# Patient Record
Sex: Female | Born: 1995 | Race: Black or African American | Hispanic: No | Marital: Single | Smoking: Never smoker
Health system: Southern US, Community
[De-identification: ages and names within clinical notes are randomized; demographics above are authoritative.]

## PROBLEM LIST (undated history)

## (undated) ENCOUNTER — Emergency Department (HOSPITAL_COMMUNITY): Discharge status: Absent without leave | Payer: Self-pay

---

## 2019-05-27 ENCOUNTER — Encounter (HOSPITAL_COMMUNITY): Payer: Self-pay

## 2019-05-27 ENCOUNTER — Other Ambulatory Visit: Payer: Self-pay

## 2019-05-27 ENCOUNTER — Emergency Department (HOSPITAL_COMMUNITY)
Admission: EM | Admit: 2019-05-27 | Discharge: 2019-05-27 | Disposition: A | Discharge status: Home / Self Care | Payer: Federal, State, Local not specified - PPO | Attending: Emergency Medicine | Admitting: Emergency Medicine

## 2019-05-27 ENCOUNTER — Emergency Department (HOSPITAL_COMMUNITY): Payer: Federal, State, Local not specified - PPO

## 2019-05-27 DIAGNOSIS — R1031 Right lower quadrant pain: Secondary | ICD-10-CM

## 2019-05-27 LAB — I-STAT BETA HCG BLOOD, ED (MC, WL, AP ONLY): I-stat hCG, quantitative: <5 m[IU]/mL

## 2019-05-27 LAB — COMPREHENSIVE METABOLIC PANEL WITH GFR
ALT: 11 U/L (ref 0–44)
AST: 14 U/L — ABNORMAL LOW (ref 15–41)
Albumin: 4.1 g/dL (ref 3.5–5.0)
Alkaline Phosphatase: 59 U/L (ref 38–126)
Anion gap: 9 (ref 5–15)
BUN: 9 mg/dL (ref 6–20)
CO2: 25 mmol/L (ref 22–32)
Calcium: 9.3 mg/dL (ref 8.9–10.3)
Chloride: 105 mmol/L (ref 98–111)
Creatinine, Ser: 0.49 mg/dL (ref 0.44–1.00)
GFR calc Af Amer: >60 mL/min
GFR calc non Af Amer: >60 mL/min
Glucose, Bld: 90 mg/dL (ref 70–99)
Potassium: 3.9 mmol/L (ref 3.5–5.1)
Sodium: 139 mmol/L (ref 135–145)
Total Bilirubin: 0.8 mg/dL (ref 0.3–1.2)
Total Protein: 7.5 g/dL (ref 6.5–8.1)

## 2019-05-27 LAB — URINALYSIS, ROUTINE W REFLEX MICROSCOPIC
Bilirubin Urine: NEGATIVE
Glucose, UA: NEGATIVE mg/dL
Hgb urine dipstick: NEGATIVE
Ketones, ur: NEGATIVE mg/dL
Leukocytes,Ua: NEGATIVE
Nitrite: NEGATIVE
Protein, ur: NEGATIVE mg/dL
Specific Gravity, Urine: >1.046 — ABNORMAL HIGH (ref 1.005–1.030)
pH: 5 (ref 5.0–8.0)

## 2019-05-27 LAB — WET PREP, GENITAL
Sperm: NONE SEEN
Trich, Wet Prep: NONE SEEN
Yeast Wet Prep HPF POC: NONE SEEN

## 2019-05-27 LAB — CBC WITH DIFFERENTIAL/PLATELET
Abs Immature Granulocytes: 0.02 10*3/uL (ref 0.00–0.07)
Basophils Absolute: 0 10*3/uL (ref 0.0–0.1)
Basophils Relative: 1 %
Eosinophils Absolute: 0.1 10*3/uL (ref 0.0–0.5)
Eosinophils Relative: 2 %
HCT: 36.6 % (ref 36.0–46.0)
Hemoglobin: 12.1 g/dL (ref 12.0–15.0)
Immature Granulocytes: 0 %
Lymphocytes Relative: 25 %
Lymphs Abs: 1.6 10*3/uL (ref 0.7–4.0)
MCH: 27 pg (ref 26.0–34.0)
MCHC: 33.1 g/dL (ref 30.0–36.0)
MCV: 81.7 fL (ref 80.0–100.0)
Monocytes Absolute: 0.5 10*3/uL (ref 0.1–1.0)
Monocytes Relative: 8 %
Neutro Abs: 4 10*3/uL (ref 1.7–7.7)
Neutrophils Relative %: 64 %
Platelets: 233 10*3/uL (ref 150–400)
RBC: 4.48 MIL/uL (ref 3.87–5.11)
RDW: 15.3 % (ref 11.5–15.5)
WBC: 6.2 10*3/uL (ref 4.0–10.5)
nRBC: 0 % (ref 0.0–0.2)

## 2019-05-27 LAB — LIPASE, BLOOD: Lipase: 25 U/L (ref 11–51)

## 2019-05-27 MED ORDER — IOHEXOL 300 MG/ML  SOLN
100.0000 mL | Freq: Once | INTRAMUSCULAR | Status: AC | PRN
  Administered 2019-05-27: 100 mL via INTRAVENOUS

## 2019-05-27 MED ORDER — MORPHINE SULFATE (PF) 4 MG/ML IV SOLN
4.0000 mg | Freq: Once | INTRAVENOUS | Status: AC
  Administered 2019-05-27: 4 mg via INTRAVENOUS
  Filled 2019-05-27: qty 1

## 2019-05-27 MED ORDER — SODIUM CHLORIDE (PF) 0.9 % IJ SOLN
INTRAMUSCULAR | Status: AC
  Filled 2019-05-27: qty 50

## 2019-05-27 MED ORDER — ONDANSETRON HCL 4 MG/2ML IJ SOLN
4.0000 mg | Freq: Once | INTRAMUSCULAR | Status: AC
  Administered 2019-05-27: 4 mg via INTRAVENOUS
  Filled 2019-05-27: qty 2

## 2019-05-27 MED ORDER — SODIUM CHLORIDE 0.9 % IV BOLUS
1000.0000 mL | Freq: Once | INTRAVENOUS | Status: AC
  Administered 2019-05-27: 1000 mL via INTRAVENOUS

## 2019-05-27 MED ORDER — SODIUM CHLORIDE 0.9% FLUSH: 3.0000 mL | Freq: Once | INTRAVENOUS | Status: DC

## 2019-05-27 NOTE — ED Notes (Signed)
Pt. Unable to urinate at this time. Will collect urine when pt. Voids. Nurse aware.  

## 2019-05-27 NOTE — ED Triage Notes (Signed)
Pt presents with c/o RLQ abdominal pain that started several hours ago. Pt denies any N/V/D.

## 2019-05-27 NOTE — Discharge Instructions (Addendum)
Your evaluation today is reassuring, your labs and imaging look good I do not see any evidence of appendicitis or an ovarian cyst.  Use ibuprofen and/or Tylenol to treat your pain, if you develop new or worsening abdominal pain, vomiting, fevers or any other new or concerning symptoms please return to the ED.

## 2019-05-27 NOTE — ED Provider Notes (Signed)
[facility]-EMERGENCY DEPT Provider Note   CSN: [id number] Arrival date & time: 05/27/19  9470     History Chief Complaint  Patient presents with  . Abdominal Pain    Sandra Silva is a 24 y.o. female.  Sandra Silva is a 24 y.o. female who is otherwise healthy, who presents for evaluation of right lower quadrant abdominal pain that started suddenly about 2 hours prior to arrival and woke her from sleep.  She reports the pain is a constant ache that intermittently become sharper.  Nothing else makes the pain better or worse, she is not tried any medications prior to arrival.  She denies any associated nausea or vomiting, no diarrhea or constipation, no dysuria, urinary frequency or hematuria.  Patient denies any similar history of pain, no history of ovarian cyst or torsion, no history of kidney stones.  She has never had any abdominal surgeries.  Denies any vaginal bleeding or discharge, just had STD testing completed with her OB/GYN a week ago and has not had any new sexual partner since then.  No other aggravating or alleviating factors.        History reviewed. No pertinent past medical history.  There are no problems to display for this patient.   History reviewed. No pertinent surgical history.   OB History   No obstetric history on file.     No family history on file.  Social History   Tobacco Use  . Smoking status: Never Smoker  . Smokeless tobacco: Never Used  Substance Use Topics  . Alcohol use: Never  . Drug use: Never    Home Medications Prior to Admission medications   Medication Sig Start Date End Date Taking? Authorizing Provider  ibuprofen (ADVIL) 200 MG tablet Take 400 mg by mouth every 6 (six) hours as needed for mild pain.   Yes [provider]  valACYclovir (VALTREX) 500 MG tablet Take 500 mg by mouth 2 (two) times daily as needed (outbreak).  12/29/18  Yes [provider]    Allergies    Patient has no known  allergies.  Review of Systems   Review of Systems  Constitutional: Negative for chills and fever.  HENT: Negative.   Respiratory: Negative for cough and shortness of breath.   Cardiovascular: Negative for chest pain.  Gastrointestinal: Positive for abdominal pain. Negative for constipation, diarrhea, nausea and vomiting.  Genitourinary: Negative for dysuria, frequency, vaginal bleeding and vaginal discharge.  Musculoskeletal: Negative for arthralgias and myalgias.  Skin: Negative for color change and rash.    Physical Exam Updated Vital Signs BP 113/81   Pulse (!) 55   Temp 97.9 F (36.6 C) (Oral)   Resp 14   LMP 05/11/2018   SpO2 98%   Physical Exam Vitals and nursing note reviewed. Exam conducted with a chaperone present.  Constitutional:      General: She is not in acute distress.    Appearance: She is well-developed. She is not diaphoretic.  HENT:     Head: Normocephalic and atraumatic.  Eyes:     General:        Right eye: No discharge.        Left eye: No discharge.     Pupils: Pupils are equal, round, and reactive to light.  Cardiovascular:     Rate and Rhythm: Normal rate and regular rhythm.     Heart sounds: Normal heart sounds.  Pulmonary:     Effort: Pulmonary effort is normal. No respiratory distress.  Breath sounds: Normal breath sounds. No wheezing or rales.  Abdominal:     General: Bowel sounds are normal. There is no distension.     Palpations: Abdomen is soft. There is no mass.     Tenderness: There is abdominal tenderness in the right lower quadrant. There is no guarding.     Comments: Abdomen is soft, nondistended, bowel sounds present throughout, there is tenderness in the right lower quadrant without guarding or peritoneal signs.  No CVA tenderness bilaterally.  Genitourinary:    Comments: Chaperone present during pelvic exam. No external genital lesions noted. Speculum exam reveals no discharge or bleeding, no cervical lesions. On bimanual  exam there is no cervical motion tenderness, no palpable adnexal masses mild tenderness on the right Musculoskeletal:        General: No deformity.     Cervical back: Neck supple.  Skin:    General: Skin is warm and dry.     Capillary Refill: Capillary refill takes less than 2 seconds.  Neurological:     Mental Status: She is alert.     Coordination: Coordination normal.     Comments: Speech is clear, able to follow commands Moves extremities without ataxia, coordination intact  Psychiatric:        Mood and Affect: Mood normal.        Behavior: Behavior normal.     ED Results / Procedures / Treatments   Labs (all labs ordered are listed, but only abnormal results are displayed) Labs Reviewed  WET PREP, GENITAL - Abnormal; Notable for the following components:      Result Value   Clue Cells Wet Prep HPF POC PRESENT (*)    WBC, Wet Prep HPF POC RARE (*)    All other components within normal limits  COMPREHENSIVE METABOLIC PANEL - Abnormal; Notable for the following components:   AST 14 (*)    All other components within normal limits  URINALYSIS, ROUTINE W REFLEX MICROSCOPIC - Abnormal; Notable for the following components:   APPearance CLEAR (*)    Specific Gravity, Urine >1.046 (*)    All other components within normal limits  LIPASE, BLOOD  CBC WITH DIFFERENTIAL/PLATELET  I-STAT BETA HCG BLOOD, ED ([facility], [facility], [facility] ONLY)    EKG None  Radiology CT ABDOMEN PELVIS W CONTRAST  Result Date: 05/27/2019 CLINICAL DATA:  Right lower quadrant pain beginning this morning. Suspect appendicitis. EXAM: CT ABDOMEN AND PELVIS WITH CONTRAST TECHNIQUE: Multidetector CT imaging of the abdomen and pelvis was performed using the standard protocol following bolus administration of intravenous contrast. CONTRAST:  OMNIPAQUE IOHEXOL 300 MG/ML  SOLN COMPARISON:  None. FINDINGS: Lower chest: No acute findings in the lung bases. Minimal linear scarring/atelectasis over the right middle lobe.  Hepatobiliary: Liver, gallbladder and biliary tree are normal. Pancreas: Normal. Spleen: Normal. Adrenals/Urinary Tract: Adrenal glands are normal. Kidneys are normal in size without hydronephrosis or nephrolithiasis. Ureters and bladder are normal. Stomach/Bowel: Stomach is normal. Small bowel is unremarkable. The appendix is normal. Air and stool present throughout the colon which is otherwise normal. Vascular/Lymphatic: Abdominal aorta is normal in caliber. No evidence of adenopathy. Reproductive: Uterus and ovaries are unremarkable. Other: No significant free fluid or focal inflammatory change. Musculoskeletal: Unremarkable. IMPRESSION: No acute findings in the abdomen/pelvis.  Normal appendix. Electronically Signed   By: Elberta Fortis M.D.   On: 05/27/2019 11:56    Procedures Procedures (including critical care time)  Medications Ordered in ED Medications  sodium chloride flush (NS) 0.9 % injection  3 mL (has no administration in time range)  sodium chloride (PF) 0.9 % injection (has no administration in time range)  sodium chloride 0.9 % bolus 1,000 mL (0 mLs Intravenous Stopped 05/27/19 1350)  ondansetron (ZOFRAN) injection 4 mg (4 mg Intravenous Given 05/27/19 1049)  morphine 4 MG/ML injection 4 mg (4 mg Intravenous Given 05/27/19 1050)  iohexol (OMNIPAQUE) 300 MG/ML solution 100 mL (100 mLs Intravenous Contrast Given 05/27/19 1135)    ED Course  I have reviewed the triage vital signs and the nursing notes.  Pertinent labs & imaging results that were available during my care of the patient were reviewed by me and considered in my medical decision making (see chart for details).    MDM Rules/Calculators/A&P                      Patient presents to the ED with complaints of abdominal pain. Patient nontoxic appearing, in no apparent distress, vitals WNL . On exam patient tender to palpation in the right lower quadrant without guarding or peritoneal signs, no peritoneal signs.  On physical  exam patient has no cervical motion tenderness to suggest PID, no significant discharge and she recently had negative STD testing last week.  She does have some mild adnexal tenderness but reports pain is worse with palpation on the abdomen than during pelvic exam.  Will evaluate with labs and CT abdomen pelvis. Analgesics, anti-emetics, and fluids administered.   ER work-up reviewed:  CBC: No leukocytosis, normal hemoglobin CMP: No electrolyte derangements, normal renal and liver function Lipase: Normal UA: No signs of urinary tract infection Preg test: Negative Imaging: CT abdomen pelvis shows normal appendix, there is also no evidence of adnexal mass or ovarian cyst, making an ovarian torsion extremely unlikely.  No other acute abnormalities noted on CT  On repeat abdominal exam patient remains without peritoneal signs, doubt cholecystitis, pancreatitis, diverticulitis, appendicitis, bowel obstruction/perforation,  PID, or ectopic pregnancy. Patient tolerating PO in the emergency department. Will discharge home with supportive measures. I discussed results, treatment plan, need for PCP follow-up, and return precautions with the patient. Provided opportunity for questions, patient confirmed understanding and is in agreement with plan.    Final Clinical Impression(s) / ED Diagnoses Final diagnoses:  Right lower quadrant abdominal pain    Rx / DC Orders ED Discharge Orders    None       Jacqlyn Larsen, [place] 05/27/19 1738    Daleen Bo, MD 05/28/19 [phone]

## 2020-08-22 IMAGING — CT CT ABD-PELV W/ CM
2 of 3 series · 13 of 36 positions shown, 19 images · IV contrast (omnipaque)
Comparison: None.

CLINICAL DATA: Right lower quadrant pain beginning this morning.
Suspect appendicitis.

EXAM:
CT ABDOMEN AND PELVIS WITH CONTRAST
TECHNIQUE: Multidetector CT imaging of the abdomen and pelvis was performed
using the standard protocol following bolus administration of
intravenous contrast.
CONTRAST:  100mL OMNIPAQUE IOHEXOL 300 MG/ML  SOLN

[Series 2: axial st · axial · 0.75mm/px · z∈[-491,-61]mm · 12 of 98 slices shown, 17 images]
[im 6/98  soft-tissue]
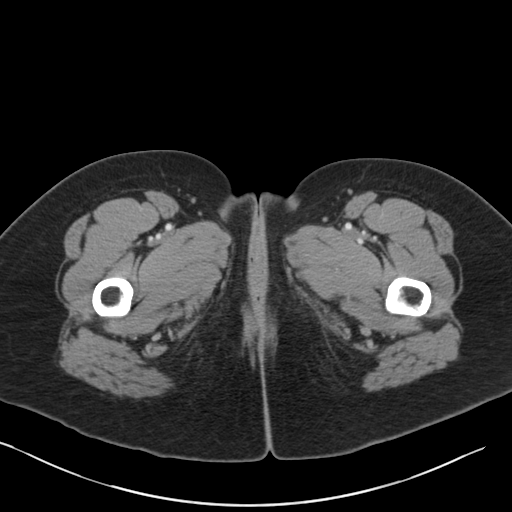
[im 6/98  bone]
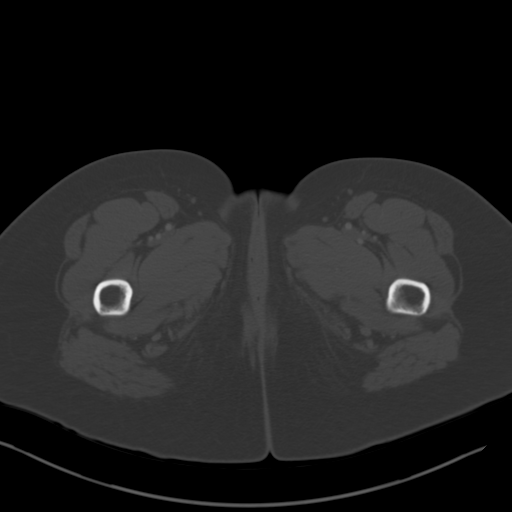
[im 18/98  soft-tissue]
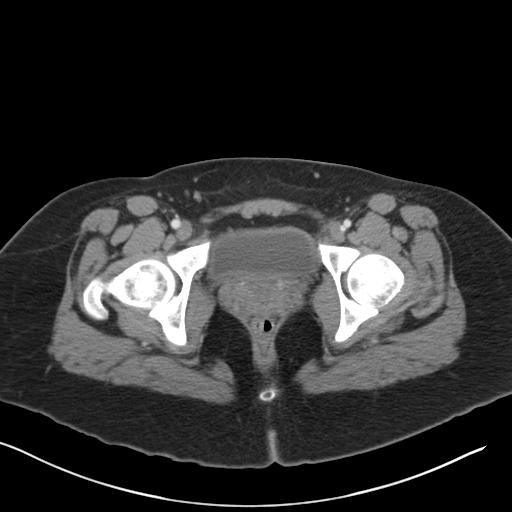
[im 23/98  soft-tissue]
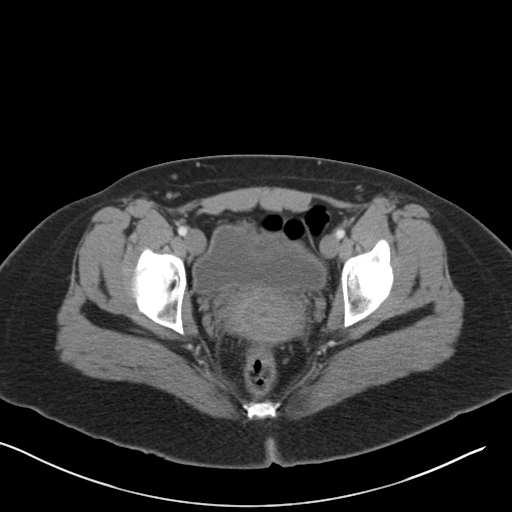
[im 35/98  soft-tissue]
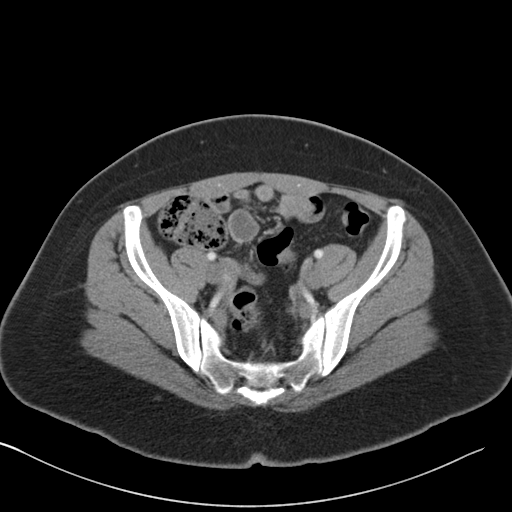
[im 40/98  soft-tissue]
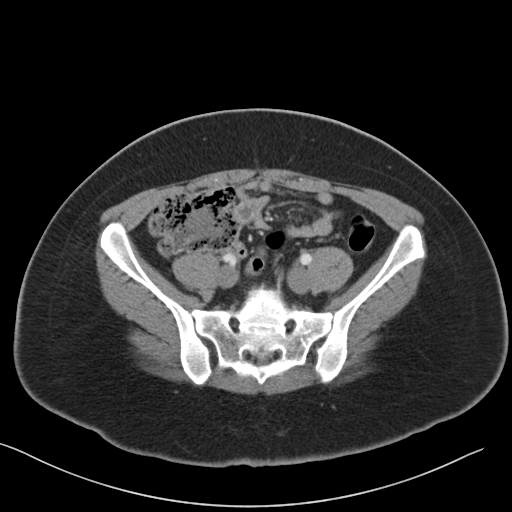
[im 52/98  soft-tissue]
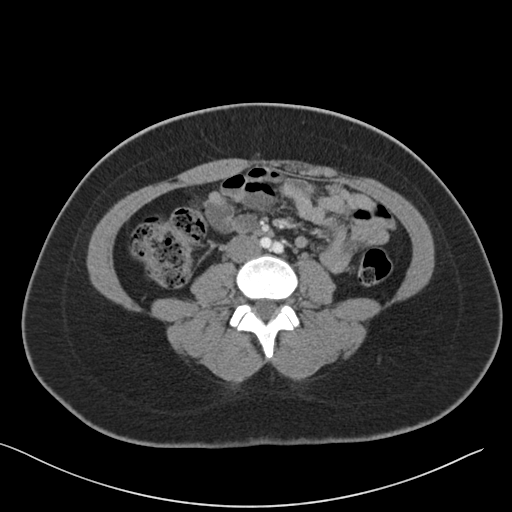
[im 58/98  soft-tissue]
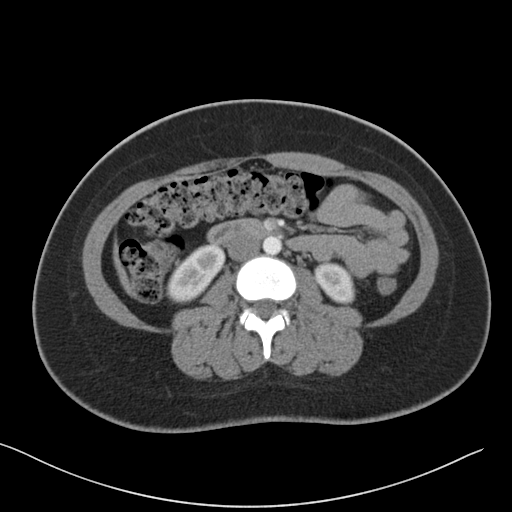
[im 63/98  soft-tissue]
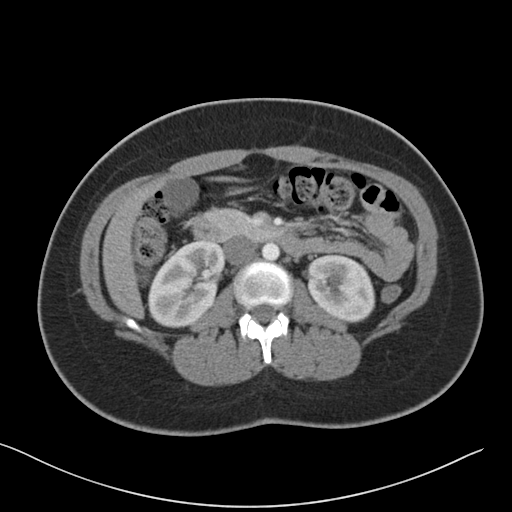
[im 75/98  soft-tissue]
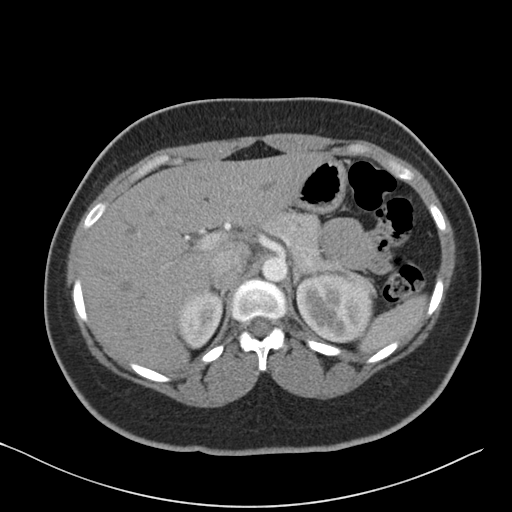
[im 75/98  lung]
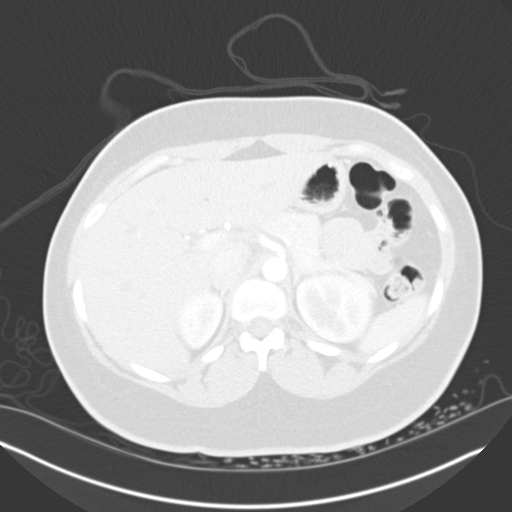
[im 75/98  bone]
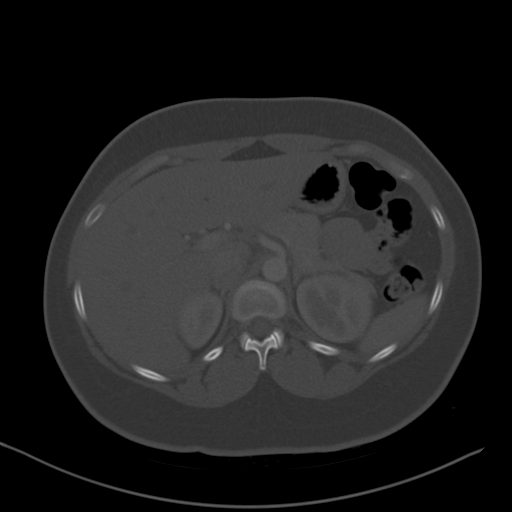
[im 80/98  soft-tissue]
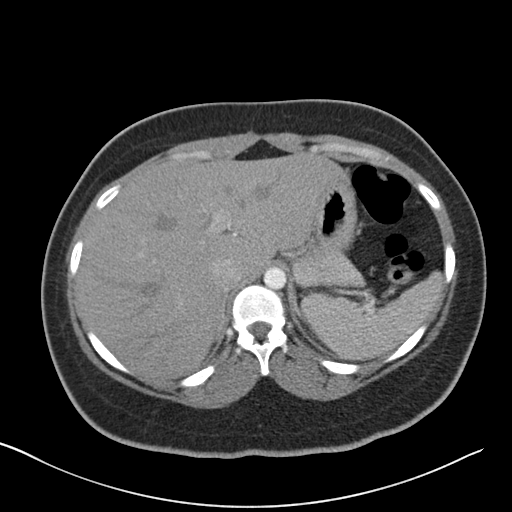
[im 80/98  lung]
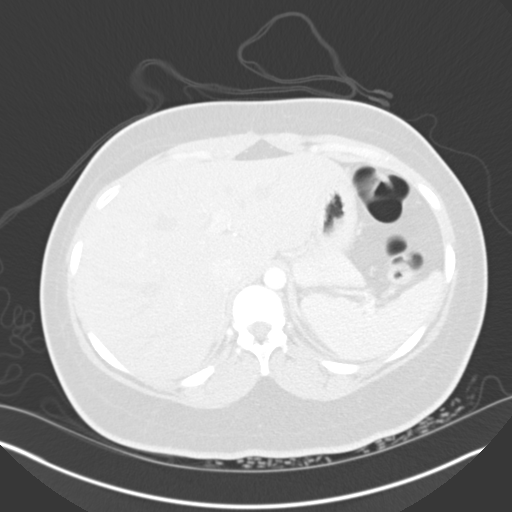
[im 86/98  lung]
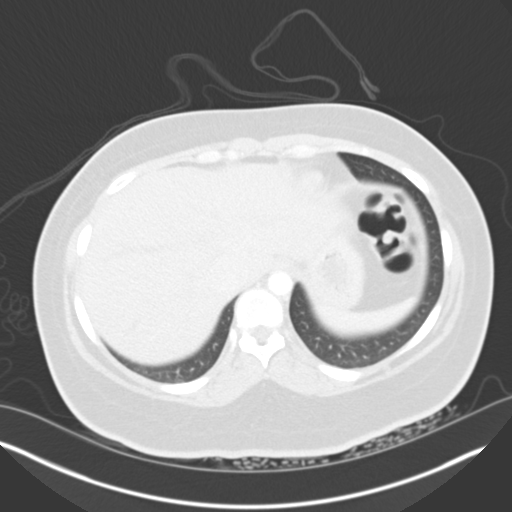
[im 92/98  soft-tissue]
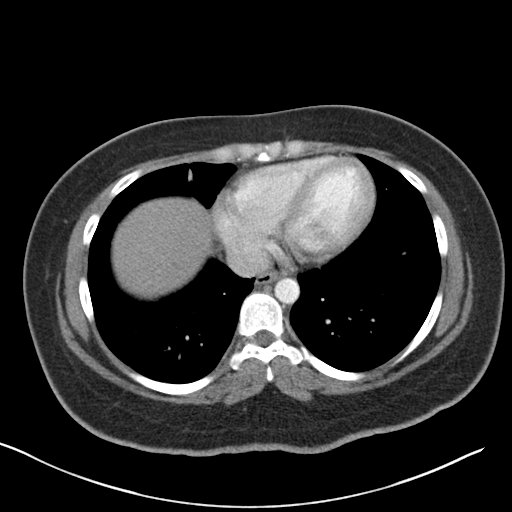
[im 92/98  lung]
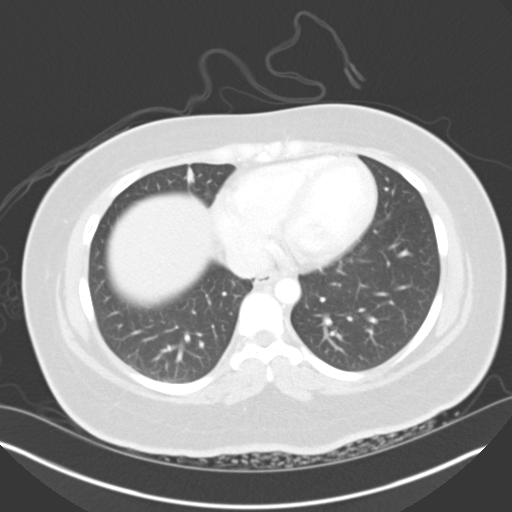

[Series 5: sagittal st · sagittal · 0.77mm/px · 1 of 172 slices shown, 2 images]
[im 58/172  soft-tissue]
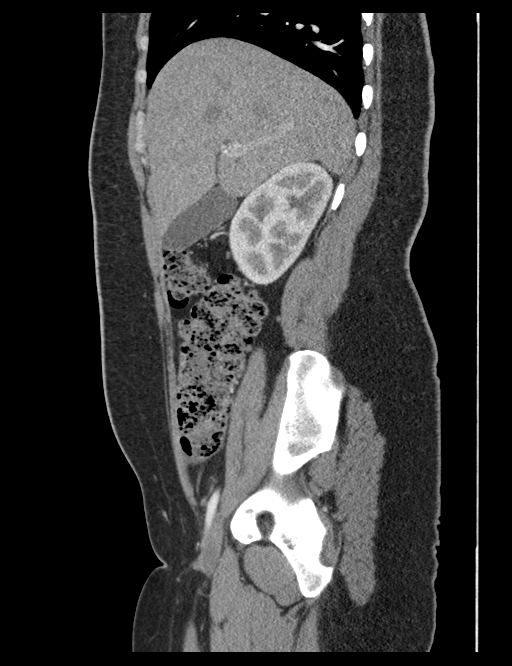
[im 58/172  bone]
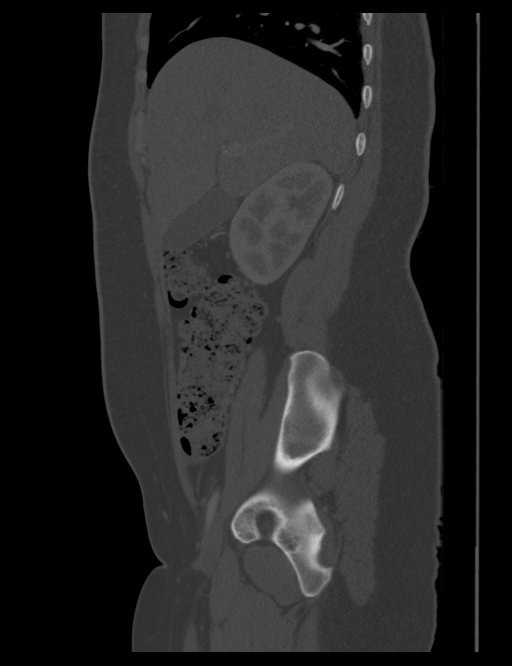

[13 of 36 positions shown; findings below may reference images not displayed]

FINDINGS: Lower chest: No acute findings in the lung bases. Minimal linear
scarring/atelectasis over the right middle lobe.

Hepatobiliary: Liver, gallbladder and biliary tree are normal.

Pancreas: Normal.

Spleen: Normal.

Adrenals/Urinary Tract: Adrenal glands are normal. Kidneys are
normal in size without hydronephrosis or nephrolithiasis. Ureters
and bladder are normal.

Stomach/Bowel: Stomach is normal. Small bowel is unremarkable. The
appendix is normal. Air and stool present throughout the colon which
is otherwise normal.

Vascular/Lymphatic: Abdominal aorta is normal in caliber. No
evidence of adenopathy.

Reproductive: Uterus and ovaries are unremarkable.

Other: No significant free fluid or focal inflammatory change.

Musculoskeletal: Unremarkable.
IMPRESSION: No acute findings in the abdomen/pelvis.  Normal appendix.

## 2020-11-24 ENCOUNTER — Other Ambulatory Visit: Payer: Self-pay

## 2020-11-24 ENCOUNTER — Ambulatory Visit (HOSPITAL_COMMUNITY)
Admission: EM | Admit: 2020-11-24 | Discharge: 2020-11-24 | Disposition: A | Discharge status: Home / Self Care | Payer: Federal, State, Local not specified - PPO | Attending: Internal Medicine | Admitting: Internal Medicine

## 2020-11-24 ENCOUNTER — Encounter (HOSPITAL_COMMUNITY): Payer: Self-pay

## 2020-11-24 DIAGNOSIS — N898 Other specified noninflammatory disorders of vagina: Secondary | ICD-10-CM | POA: Insufficient documentation

## 2020-11-24 DIAGNOSIS — R350 Frequency of micturition: Secondary | ICD-10-CM | POA: Insufficient documentation

## 2020-11-24 DIAGNOSIS — Z113 Encounter for screening for infections with a predominantly sexual mode of transmission: Secondary | ICD-10-CM | POA: Diagnosis not present

## 2020-11-24 LAB — POCT URINALYSIS DIPSTICK, ED / UC
Bilirubin Urine: NEGATIVE
Glucose, UA: NEGATIVE mg/dL
Ketones, ur: NEGATIVE mg/dL
Nitrite: NEGATIVE
Protein, ur: NEGATIVE mg/dL
Specific Gravity, Urine: 1.015 (ref 1.005–1.030)
Urobilinogen, UA: 0.2 mg/dL (ref 0.0–1.0)
pH: 6 (ref 5.0–8.0)

## 2020-11-24 LAB — POC URINE PREG, ED: Preg Test, Ur: NEGATIVE

## 2020-11-24 NOTE — Discharge Instructions (Addendum)
Your STD vaginal swab and urine culture are pending.  We will call if these are positive and treat as appropriate.  Please go the hospital if any pelvic pain, abdominal pain, fever develop.

## 2020-11-24 NOTE — ED Triage Notes (Signed)
Pt present vaginal itching with urinary frequency. Symptom started on Thursday. Pt states that her vagina is itching with a burning sensation on the outside of the vagina.

## 2020-11-24 NOTE — ED Provider Notes (Signed)
[facility]    CSN: [id number] Arrival date & time: 11/24/20  1743      History   Chief Complaint Chief Complaint  Patient presents with   Vaginal Itching    HPI Sandra Silva is a 25 y.o. female.   Patient presents with 4-day history of vaginal itching, vaginal discharge, urinary frequency.  Denies any urinary burning.  Denies any pelvic pain, abdominal pain, fever, back pain.  Patient does have history of genital herpes but states that this does not feel like a typical flare-up, and she has never experienced these symptoms before.  Denies any known exposure to STD.  Patient is currently on her menstrual cycle.  Patient denies any vaginal lesions.   Vaginal Itching   History reviewed. No pertinent past medical history.  There are no problems to display for this patient.   History reviewed. No pertinent surgical history.  OB History   No obstetric history on file.      Home Medications    Prior to Admission medications   Medication Sig Start Date End Date Taking? Authorizing Provider  ibuprofen (ADVIL) 200 MG tablet Take 400 mg by mouth every 6 (six) hours as needed for mild pain.    [provider]  valACYclovir (VALTREX) 500 MG tablet Take 500 mg by mouth 2 (two) times daily as needed (outbreak).  12/29/18   [provider]    Family History History reviewed. No pertinent family history.  Social History Social History   Tobacco Use   Smoking status: Never   Smokeless tobacco: Never  Substance Use Topics   Alcohol use: Never   Drug use: Never     Allergies   Patient has no known allergies.   Review of Systems Review of Systems Per HPI  Physical Exam Triage Vital Signs ED Triage Vitals  Enc Vitals Group     BP 11/24/20 1844 122/86     Pulse Rate 11/24/20 1844 70     Resp 11/24/20 1844 16     Temp 11/24/20 1844 98.9 F (37.2 C)     Temp Source 11/24/20 1844 Oral     SpO2 11/24/20 1844 98 %     Weight --       Height --      Head Circumference --      Peak Flow --      Pain Score 11/24/20 1842 0     Pain Loc --      Pain Edu? --      Excl. in GC? --    No data found.  Updated Vital Signs BP 122/86 (BP Location: Left Arm)   Pulse 70   Temp 98.9 F (37.2 C) (Oral)   Resp 16   LMP 11/24/2020   SpO2 98%   Visual Acuity Right Eye Distance:   Left Eye Distance:   Bilateral Distance:    Right Eye Near:   Left Eye Near:    Bilateral Near:     Physical Exam Constitutional:      Appearance: Normal appearance.  HENT:     Head: Normocephalic and atraumatic.  Eyes:     Extraocular Movements: Extraocular movements intact.     Conjunctiva/sclera: Conjunctivae normal.  Pulmonary:     Effort: Pulmonary effort is normal.  Genitourinary:    Comments: Deferred with shared decision making.  Self swab performed. Neurological:     General: No focal deficit present.     Mental Status: She is alert and oriented to  person, place, and time. Mental status is at baseline.  Psychiatric:        Mood and Affect: Mood normal.        Behavior: Behavior normal.        Thought Content: Thought content normal.        Judgment: Judgment normal.     UC Treatments / Results  Labs (all labs ordered are listed, but only abnormal results are displayed) Labs Reviewed  POCT URINALYSIS DIPSTICK, ED / UC - Abnormal; Notable for the following components:      Result Value   Hgb urine dipstick MODERATE (*)    Leukocytes,Ua TRACE (*)    All other components within normal limits  URINE CULTURE  POC URINE PREG, ED  CERVICOVAGINAL ANCILLARY ONLY    EKG   Radiology No results found.  Procedures Procedures (including critical care time)  Medications Ordered in UC Medications - No data to display  Initial Impression / Assessment and Plan / UC Course  I have reviewed the triage vital signs and the nursing notes.  Pertinent labs & imaging results that were available during my care of the patient were  reviewed by me and considered in my medical decision making (see chart for details).     Urinalysis not definitive for any urinary tract infection.  STD vaginal swab and urine culture pending. Will wait these results for appropriate treatment.  Advised patient to go to hospital if any pelvic pain, abdominal pain, fever develop.  Patient is nontoxic-appearing does not need immediate medical evaluation in emergency department at this time.Discussed strict return precautions. Patient verbalized understanding and is agreeable with plan.  Final Clinical Impressions(s) / UC Diagnoses   Final diagnoses:  Vaginal itching  Urinary frequency  Screening for venereal disease  Vaginal discharge     Discharge Instructions      Your STD vaginal swab and urine culture are pending.  We will call if these are positive and treat as appropriate.  Please go the hospital if any pelvic pain, abdominal pain, fever develop.     ED Prescriptions   None    PDMP not reviewed this encounter.   Lance Muss, FNP 11/24/20 1907

## 2020-11-25 LAB — CERVICOVAGINAL ANCILLARY ONLY
Bacterial Vaginitis (gardnerella): POSITIVE — AB
Candida Glabrata: NEGATIVE
Candida Vaginitis: POSITIVE — AB
Chlamydia: NEGATIVE
Comment: NEGATIVE
Comment: NEGATIVE
Comment: NEGATIVE
Comment: NEGATIVE
Comment: NEGATIVE
Comment: NORMAL
Neisseria Gonorrhea: NEGATIVE
Trichomonas: NEGATIVE

## 2020-11-26 ENCOUNTER — Telehealth (HOSPITAL_COMMUNITY): Payer: Self-pay | Admitting: Emergency Medicine

## 2020-11-26 LAB — URINE CULTURE: Culture: <10000 — AB

## 2020-11-26 MED ORDER — METRONIDAZOLE 500 MG PO TABS: 500.0000 mg | ORAL_TABLET | Freq: Two times a day (BID) | ORAL | 0 refills | Status: DC

## 2020-11-26 MED ORDER — FLUCONAZOLE 150 MG PO TABS: 150.0000 mg | ORAL_TABLET | Freq: Once | ORAL | 0 refills | Status: AC

## 2021-03-11 ENCOUNTER — Ambulatory Visit (HOSPITAL_COMMUNITY)
Admission: RE | Admit: 2021-03-11 | Discharge: 2021-03-11 | Disposition: A | Discharge status: Home / Self Care | Payer: Federal, State, Local not specified - PPO | Source: Ambulatory Visit | Attending: Student | Admitting: Student

## 2021-03-11 ENCOUNTER — Other Ambulatory Visit: Payer: Self-pay

## 2021-03-11 ENCOUNTER — Encounter (HOSPITAL_COMMUNITY): Payer: Self-pay

## 2021-03-11 VITALS — BP 125/79 | HR 65 | Temp 98.2°F | Resp 18

## 2021-03-11 DIAGNOSIS — Z113 Encounter for screening for infections with a predominantly sexual mode of transmission: Secondary | ICD-10-CM | POA: Insufficient documentation

## 2021-03-11 DIAGNOSIS — N760 Acute vaginitis: Secondary | ICD-10-CM | POA: Diagnosis present

## 2021-03-11 LAB — POC URINE PREG, ED: Preg Test, Ur: NEGATIVE

## 2021-03-11 MED ORDER — METRONIDAZOLE 500 MG PO TABS: 500.0000 mg | ORAL_TABLET | Freq: Two times a day (BID) | ORAL | 0 refills | Status: AC

## 2021-03-11 MED ORDER — FLUCONAZOLE 150 MG PO TABS: 150.0000 mg | ORAL_TABLET | Freq: Every day | ORAL | 0 refills | Status: AC

## 2021-03-11 NOTE — ED Triage Notes (Signed)
Pt is present today with vaginal discharge and odor. Pt states sx started x2 weeks ago. Pt has also been exposed to an STD and would like to be tested and treated

## 2021-03-11 NOTE — ED Provider Notes (Signed)
[facility]    CSN: [id number] Arrival date & time: 03/11/21  1055      History   Chief Complaint Chief Complaint  Patient presents with   Vaginal Discharge    Vaginal odor     HPI Sandra Silva is a 25 y.o. female presenting with vaginitis for about 2 weeks.  Medical history BV and yeast 10/2020, states that she did not finish the Flagyl as directed.  She is having intercourse with the same female partner, she is not sure if he is monogamous.  She describes 2 weeks of white malodorous vaginal discharge, minimal external irritation and itching.  Denies abdominal pain, flank pain, fever/chills, dysuria, hematuria.  States that she was prescribed Flagyl and Diflucan in July, but she accidentally drank alcohol and became very sick and could not finish the prescription.  HPI  History reviewed. No pertinent past medical history.  There are no problems to display for this patient.   History reviewed. No pertinent surgical history.  OB History   No obstetric history on file.      Home Medications    Prior to Admission medications   Medication Sig Start Date End Date Taking? Authorizing Provider  fluconazole (DIFLUCAN) 150 MG tablet Take 1 tablet (150 mg total) by mouth daily. -For your yeast infection, start the Diflucan (fluconazole)- Take one pill today (day 1). If you're still having symptoms in 3 days, take the second pill. 03/11/21  Yes Rhys MartiniGraham, Lemar Bakos E, PA-C  metroNIDAZOLE (FLAGYL) 500 MG tablet Take 1 tablet (500 mg total) by mouth 2 (two) times daily. Avoid alcohol while taking this medication and for 2 days after 03/11/21  Yes Rhys MartiniGraham, Sreya Froio E, PA-C  ibuprofen (ADVIL) 200 MG tablet Take 400 mg by mouth every 6 (six) hours as needed for mild pain.    [provider]  valACYclovir (VALTREX) 500 MG tablet Take 500 mg by mouth 2 (two) times daily as needed (outbreak).  12/29/18   [provider]    Family History History reviewed. No pertinent family  history.  Social History Social History   Tobacco Use   Smoking status: Never   Smokeless tobacco: Never  Substance Use Topics   Alcohol use: Never   Drug use: Never     Allergies   Patient has no known allergies.   Review of Systems Review of Systems  Constitutional:  Negative for chills and fever.  HENT:  Negative for sore throat.   Eyes:  Negative for pain and redness.  Respiratory:  Negative for shortness of breath.   Cardiovascular:  Negative for chest pain.  Gastrointestinal:  Negative for abdominal pain, diarrhea, nausea and vomiting.  Genitourinary:  Positive for vaginal discharge. Negative for decreased urine volume, difficulty urinating, dysuria, flank pain, frequency, genital sores, hematuria and urgency.  Musculoskeletal:  Negative for back pain.  Skin:  Negative for rash.  All other systems reviewed and are negative.   Physical Exam Triage Vital Signs ED Triage Vitals  Enc Vitals Group     BP 03/11/21 1123 125/79     Pulse Rate 03/11/21 1123 65     Resp 03/11/21 1123 18     Temp 03/11/21 1126 98.2 F (36.8 C)     Temp src --      SpO2 03/11/21 1123 98 %     Weight --      Height --      Head Circumference --      Peak Flow --  Pain Score 03/11/21 1122 0     Pain Loc --      Pain Edu? --      Excl. in GC? --    No data found.  Updated Vital Signs BP 125/79   Pulse 65   Temp 98.2 F (36.8 C)   Resp 18   LMP 03/06/2021 (Approximate)   SpO2 98%   Visual Acuity Right Eye Distance:   Left Eye Distance:   Bilateral Distance:    Right Eye Near:   Left Eye Near:    Bilateral Near:     Physical Exam Vitals reviewed.  Constitutional:      General: She is not in acute distress.    Appearance: Normal appearance. She is not ill-appearing.  HENT:     Head: Normocephalic and atraumatic.     Mouth/Throat:     Mouth: Mucous membranes are moist.     Comments: Moist mucous membranes Eyes:     Extraocular Movements: Extraocular movements  intact.     Pupils: Pupils are equal, round, and reactive to light.  Cardiovascular:     Rate and Rhythm: Normal rate and regular rhythm.     Heart sounds: Normal heart sounds.  Pulmonary:     Effort: Pulmonary effort is normal.     Breath sounds: Normal breath sounds. No wheezing, rhonchi or rales.  Abdominal:     General: Bowel sounds are normal. There is no distension.     Palpations: Abdomen is soft. There is no mass.     Tenderness: There is no abdominal tenderness. There is no right CVA tenderness, left CVA tenderness, guarding or rebound.  Genitourinary:    Comments: deferred Skin:    General: Skin is warm.     Capillary Refill: Capillary refill takes less than 2 seconds.     Comments: Good skin turgor  Neurological:     General: No focal deficit present.     Mental Status: She is alert and oriented to person, place, and time.  Psychiatric:        Mood and Affect: Mood normal.        Behavior: Behavior normal.     UC Treatments / Results  Labs (all labs ordered are listed, but only abnormal results are displayed) Labs Reviewed  POC URINE PREG, ED  CERVICOVAGINAL ANCILLARY ONLY    EKG   Radiology No results found.  Procedures Procedures (including critical care time)  Medications Ordered in UC Medications - No data to display  Initial Impression / Assessment and Plan / UC Course  I have reviewed the triage vital signs and the nursing notes.  Pertinent labs & imaging results that were available during my care of the patient were reviewed by me and considered in my medical decision making (see chart for details).     This patient is a very pleasant 25 y.o. year old female presenting with vaginitis. Afebrile, nontachycardic, no reproducible abd pain or CVAT. Same female partner but he is not monogamous. She was diagnosed with BV and yeast 10/2020 and did not finish the flagyl.  U-preg negative.  Will send self-swab for G/C, trich, yeast, BV testing. Declines  HIV, RPR. Safe sex precautions.   Will treat with flagyl and diflucan  ED return precautions discussed. Patient verbalizes understanding and agreement.     Final Clinical Impressions(s) / UC Diagnoses   Final diagnoses:  Vaginitis and vulvovaginitis  Routine screening for STI (sexually transmitted infection)     Discharge Instructions      -  For your yeast infection, start the Diflucan (fluconazole)- Take one pill today (day 1). If you're still having symptoms in 3 days, take the second pill.  -For bacterial vaginosis, start the antibiotic-Flagyl (metronidazole), 2 pills daily for 7 days.  You can take this with food if you have a sensitive stomach.  Avoid alcohol while taking this medication and for 2 days after as this will cause severe nausea and vomiting. -We have sent testing for sexually transmitted infections. We will notify you of any positive results once they are received. If required, we will prescribe any medications you might need. Please refrain from all sexual activity until treatment is complete.  -Seek additional medical attention if you develop fevers/chills, new/worsening abdominal pain, new/worsening vaginal discomfort/discharge, etc.      ED Prescriptions     Medication Sig Dispense Auth. Provider   metroNIDAZOLE (FLAGYL) 500 MG tablet Take 1 tablet (500 mg total) by mouth 2 (two) times daily. Avoid alcohol while taking this medication and for 2 days after 14 tablet Hazel Sams, PA-C   fluconazole (DIFLUCAN) 150 MG tablet Take 1 tablet (150 mg total) by mouth daily. -For your yeast infection, start the Diflucan (fluconazole)- Take one pill today (day 1). If you're still having symptoms in 3 days, take the second pill. 2 tablet Hazel Sams, PA-C      PDMP not reviewed this encounter.   Hazel Sams, PA-C 03/11/21 1209

## 2021-03-11 NOTE — Discharge Instructions (Addendum)
-  For your yeast infection, start the Diflucan (fluconazole)- Take one pill today (day 1). If you're still having symptoms in 3 days, take the second pill.  -For bacterial vaginosis, start the antibiotic-Flagyl (metronidazole), 2 pills daily for 7 days.  You can take this with food if you have a sensitive stomach.  Avoid alcohol while taking this medication and for 2 days after as this will cause severe nausea and vomiting. -We have sent testing for sexually transmitted infections. We will notify you of any positive results once they are received. If required, we will prescribe any medications you might need. Please refrain from all sexual activity until treatment is complete.  -Seek additional medical attention if you develop fevers/chills, new/worsening abdominal pain, new/worsening vaginal discomfort/discharge, etc.

## 2021-03-12 LAB — CERVICOVAGINAL ANCILLARY ONLY
Bacterial Vaginitis (gardnerella): NEGATIVE
Candida Glabrata: NEGATIVE
Candida Vaginitis: POSITIVE — AB
Chlamydia: NEGATIVE
Comment: NEGATIVE
Comment: NEGATIVE
Comment: NEGATIVE
Comment: NEGATIVE
Comment: NEGATIVE
Comment: NORMAL
Neisseria Gonorrhea: NEGATIVE
Trichomonas: NEGATIVE
